# Patient Record
Sex: Female | Born: 1980 | Race: White | Hispanic: No | Marital: Married
Health system: Southern US, Community
[De-identification: ages and names within clinical notes are randomized; demographics above are authoritative.]

---

## 2006-09-20 ENCOUNTER — Inpatient Hospital Stay (HOSPITAL_COMMUNITY): Admission: AD | Admit: 2006-09-20 | Discharge: 2006-09-20 | Payer: Self-pay | Admitting: Obstetrics & Gynecology

## 2006-10-16 ENCOUNTER — Ambulatory Visit: Payer: Self-pay | Admitting: Hematology & Oncology

## 2006-11-01 ENCOUNTER — Inpatient Hospital Stay (HOSPITAL_COMMUNITY): Admission: AD | Admit: 2006-11-01 | Discharge: 2006-11-01 | Payer: Self-pay | Admitting: Obstetrics

## 2006-12-08 ENCOUNTER — Ambulatory Visit: Payer: Self-pay | Admitting: Hematology & Oncology

## 2006-12-10 LAB — CBC WITH DIFFERENTIAL/PLATELET
Basophils Absolute: 0 10*3/uL (ref 0.0–0.1)
EOS%: 1.2 % (ref 0.0–7.0)
HCT: 35.3 % (ref 34.8–46.6)
HGB: 12.3 g/dL (ref 11.6–15.9)
MCH: 29.6 pg (ref 26.0–34.0)
MCHC: 34.7 g/dL (ref 32.0–36.0)
MCV: 85.3 fL (ref 81.0–101.0)
MONO%: 6.6 % (ref 0.0–13.0)
NEUT%: 68.3 % (ref 39.6–76.8)
RDW: 14.1 % (ref 11.3–14.5)

## 2006-12-12 ENCOUNTER — Inpatient Hospital Stay (HOSPITAL_COMMUNITY): Admission: AD | Admit: 2006-12-12 | Discharge: 2006-12-12 | Payer: Self-pay | Admitting: Obstetrics and Gynecology

## 2007-03-08 ENCOUNTER — Ambulatory Visit: Payer: Self-pay | Admitting: Hematology & Oncology

## 2007-03-10 LAB — CBC WITH DIFFERENTIAL/PLATELET
Basophils Absolute: 0 10*3/uL (ref 0.0–0.1)
EOS%: 0.8 % (ref 0.0–7.0)
HCT: 37.1 % (ref 34.8–46.6)
HGB: 13.2 g/dL (ref 11.6–15.9)
LYMPH%: 19 % (ref 14.0–48.0)
MCH: 30.6 pg (ref 26.0–34.0)
MCV: 86.3 fL (ref 81.0–101.0)
NEUT%: 73.2 % (ref 39.6–76.8)
Platelets: 138 10*3/uL — ABNORMAL LOW (ref 145–400)
lymph#: 2.1 10*3/uL (ref 0.9–3.3)

## 2007-03-30 ENCOUNTER — Inpatient Hospital Stay (HOSPITAL_COMMUNITY): Admission: AD | Admit: 2007-03-30 | Discharge: 2007-03-30 | Payer: Self-pay | Admitting: Obstetrics & Gynecology

## 2007-04-08 LAB — CBC WITH DIFFERENTIAL/PLATELET
BASO%: 0.3 % (ref 0.0–2.0)
Basophils Absolute: 0 10*3/uL (ref 0.0–0.1)
Eosinophils Absolute: 0.1 10*3/uL (ref 0.0–0.5)
HCT: 35.7 % (ref 34.8–46.6)
HGB: 12.3 g/dL (ref 11.6–15.9)
LYMPH%: 18.1 % (ref 14.0–48.0)
MONO#: 0.7 10*3/uL (ref 0.1–0.9)
NEUT#: 9.1 10*3/uL — ABNORMAL HIGH (ref 1.5–6.5)
NEUT%: 75.1 % (ref 39.6–76.8)
Platelets: 135 10*3/uL — ABNORMAL LOW (ref 145–400)
WBC: 12.2 10*3/uL — ABNORMAL HIGH (ref 3.9–10.0)
lymph#: 2.2 10*3/uL (ref 0.9–3.3)

## 2007-04-24 ENCOUNTER — Inpatient Hospital Stay (HOSPITAL_COMMUNITY): Admission: AD | Admit: 2007-04-24 | Discharge: 2007-04-26 | Payer: Self-pay | Admitting: Obstetrics and Gynecology

## 2007-05-26 ENCOUNTER — Ambulatory Visit: Payer: Self-pay | Admitting: Hematology & Oncology

## 2007-05-31 LAB — CBC WITH DIFFERENTIAL/PLATELET
BASO%: 2.3 % — ABNORMAL HIGH (ref 0.0–2.0)
Eosinophils Absolute: 0.2 10*3/uL (ref 0.0–0.5)
MCHC: 34.3 g/dL (ref 32.0–36.0)
MONO#: 0.5 10*3/uL (ref 0.1–0.9)
NEUT#: 3.6 10*3/uL (ref 1.5–6.5)
Platelets: 203 10*3/uL (ref 145–400)
RBC: 4.48 10*6/uL (ref 3.70–5.32)
WBC: 6.9 10*3/uL (ref 3.9–10.0)
lymph#: 2.5 10*3/uL (ref 0.9–3.3)

## 2008-05-29 ENCOUNTER — Encounter: Admission: RE | Admit: 2008-05-29 | Discharge: 2008-05-29 | Payer: Self-pay | Admitting: Family Medicine

## 2009-08-11 IMAGING — US US OB TRANSVAGINAL MODIFY
1 series · 13 of 28 positions shown · non-contrast
Comparison: None.

CLINICAL DATA: 8 weeks pregnant with vaginal discharge.   
OBSTETRICAL ULTRASOUND <14 WKS AND TRANSVAGINAL OB US:
TECHNIQUE: Both transabdominal and transvaginal ultrasound examinations were performed for complete evaluation of the gestation as well as the maternal uterus, adnexal regions, and pelvic cul-de-sac.

[Series 1: us ob comp less 14 wks · 13 of 50 slices shown]
[im 2/50]
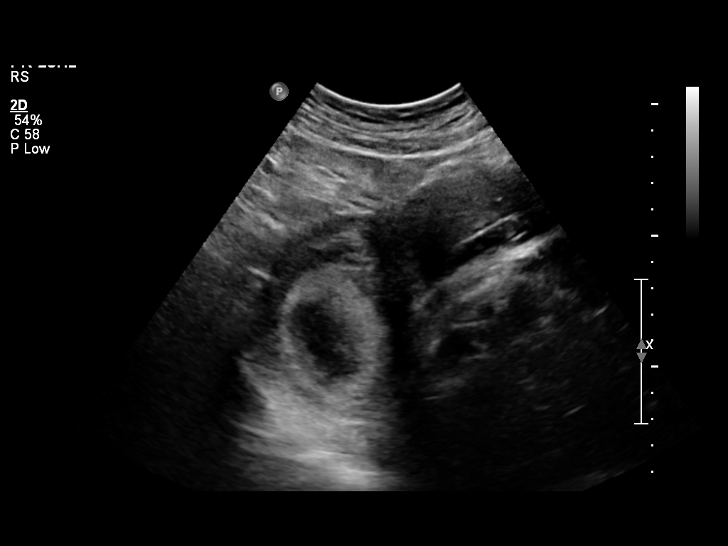
[im 6/50]
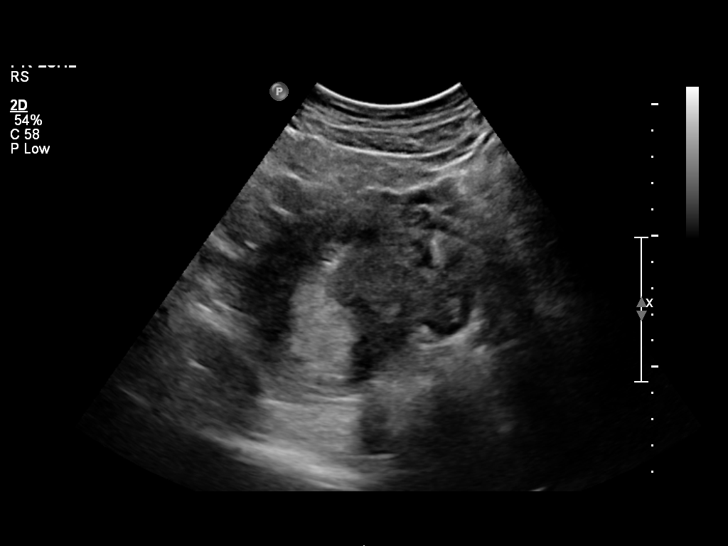
[im 10/50]
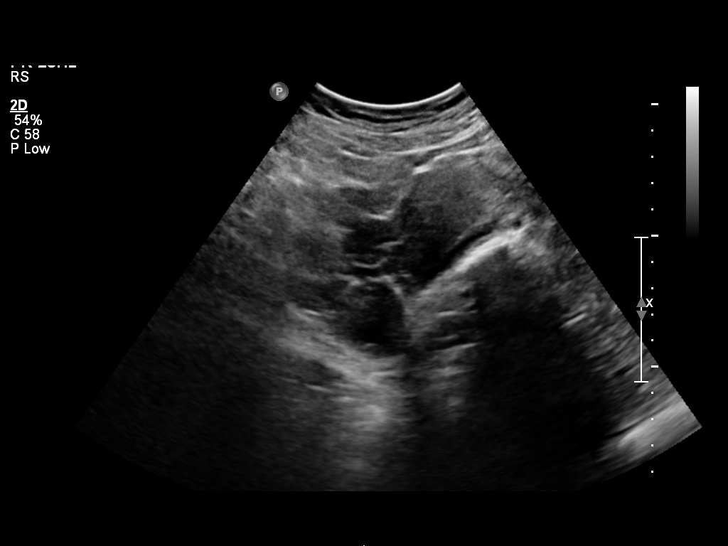
[im 13/50]
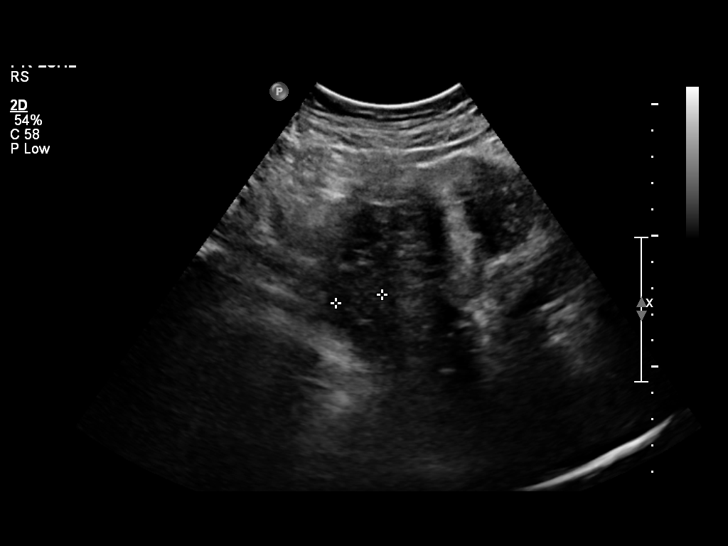
[im 17/50]
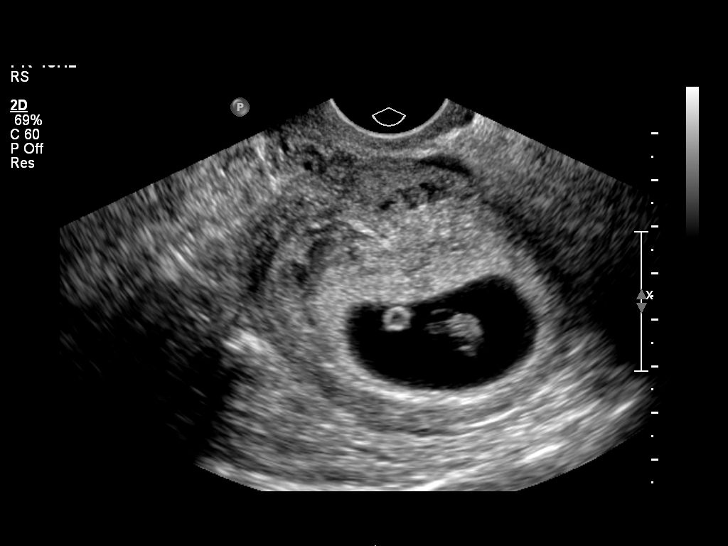
[im 20/50]
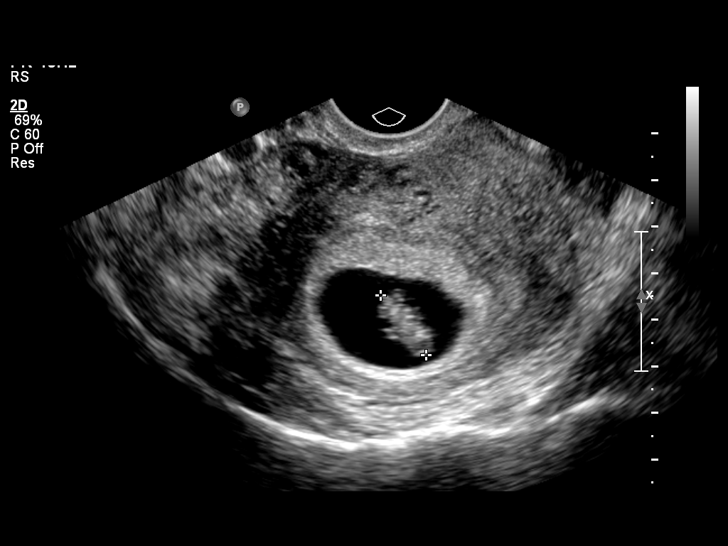
[im 26/50]
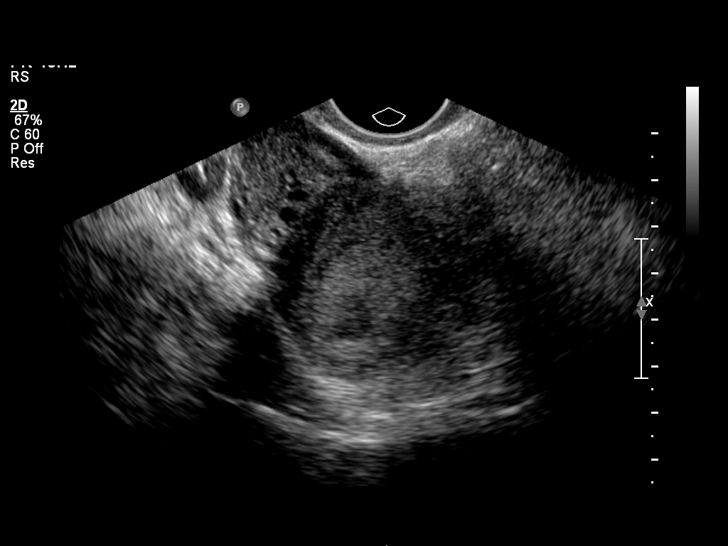
[im 30/50]
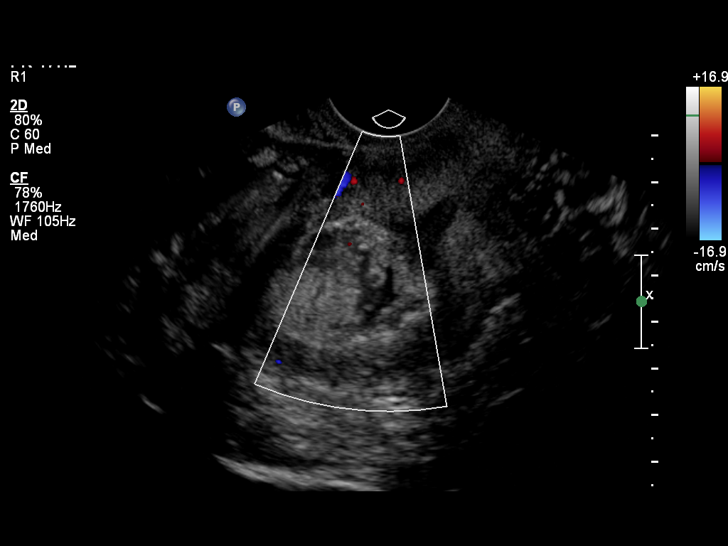
[im 33/50]
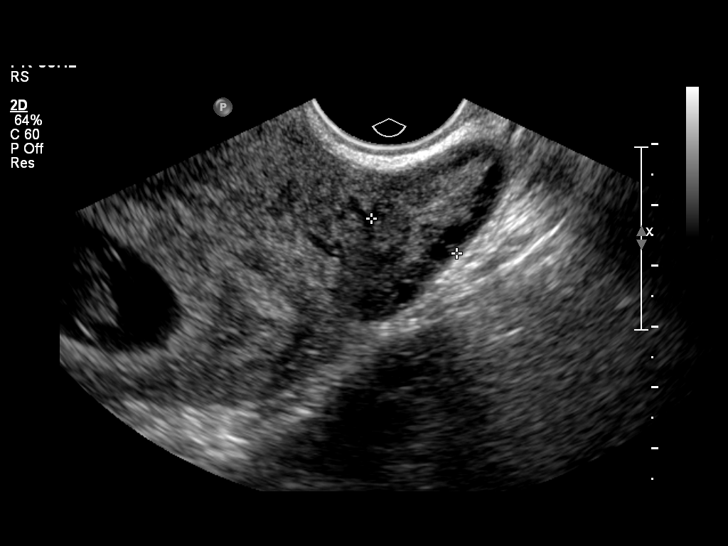
[im 37/50]
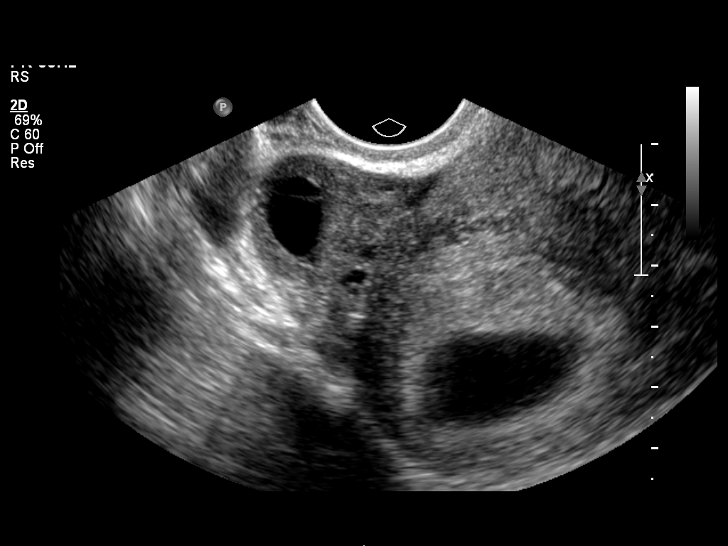
[im 40/50]
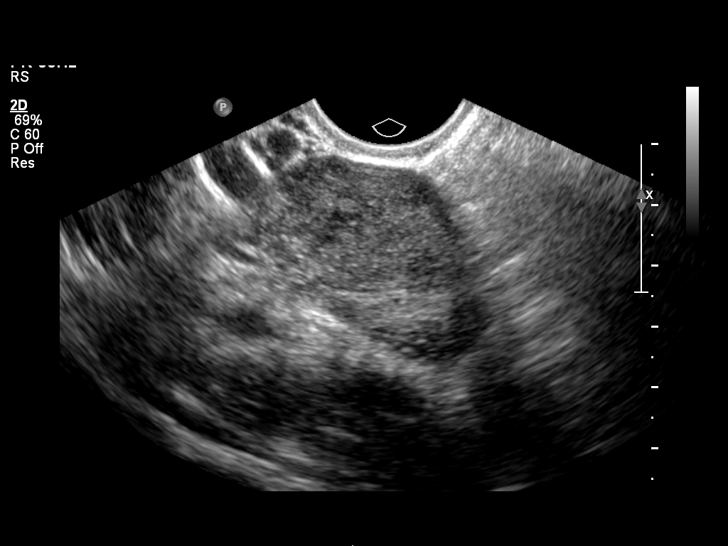
[im 44/50]
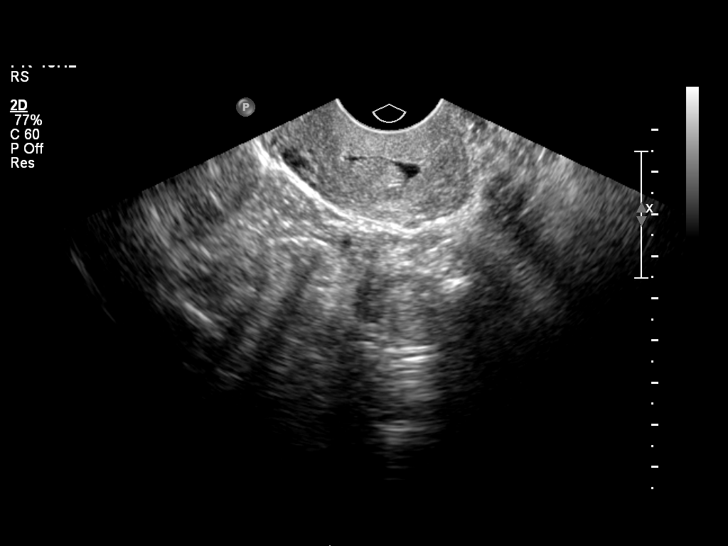
[im 48/50]
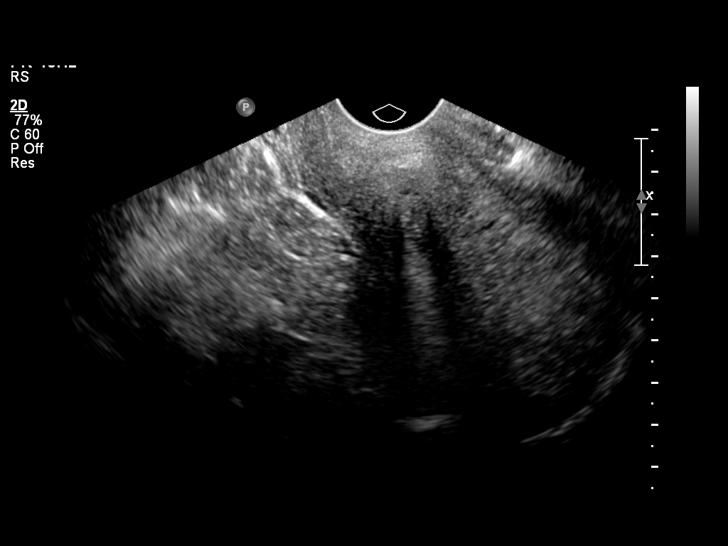

[13 of 28 positions shown; findings below may reference images not displayed]

FINDINGS: A single living intrauterine pregnancy is identified with a crown-rump length of 1.6 cm, corresponding to an estimated gestational age of 8 weeks 1 day.  This corresponds with the patient?s last menstrual period provided, with estimated gestational age by LMP of 8 weeks 3 days.  Cardiac activity is identified at 162 bpm.  Yolk sac is identified and appears normal.  A small subchorionic hemorrhage is noted.  The uterine cervix measures 3 cm in length and is closed.  Note is made of a small amount of fluid in the cervical canal.  The ovaries are normal in size, measuring 2.8 x 1.8 x 1.5 cm on the left and 2.7 x 3.7 x 2.4 cm on the right.  corpus luteum is in the right ovary.  
No free fluid is identified in the cul-de-sac.
IMPRESSION: 1.  Single living intrauterine pregnancy with estimated gestational age by LMP of 8 weeks 3 days.  The dates measured by ultrasound correspond to the LMP.  
2.  Small subchorionic hemorrhage.  
3.  Small amount of fluid in the cervical canal.

## 2010-02-18 IMAGING — US US OB COMP +14 WK
1 series · 14 of 23 positions shown · non-contrast
Comparison: none

OBSTETRICAL ULTRASOUND:

 This ultrasound exam was performed in the [HOSPITAL] Ultrasound Department.  The OB US report was generated in the [facility] system, and faxed to the ordering physician.  This report is also available in [REDACTED] PACS.

[Series 1: us ob comp +14 wk · 14 of 23 slices shown]
[im 1/23]
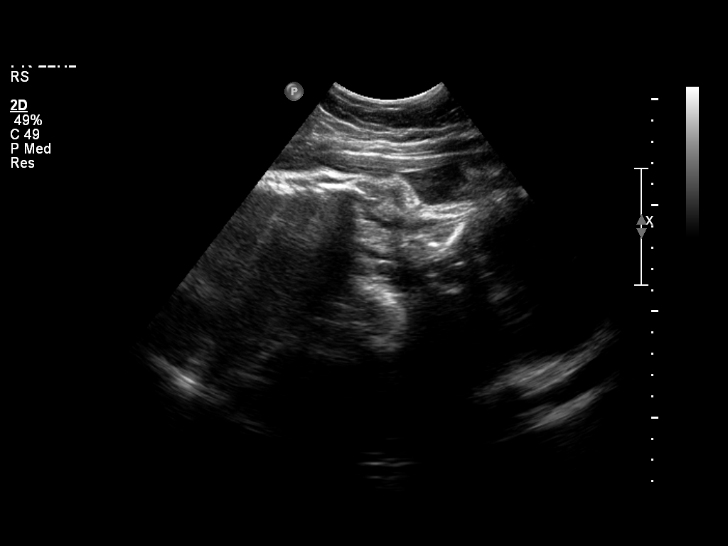
[im 3/23]
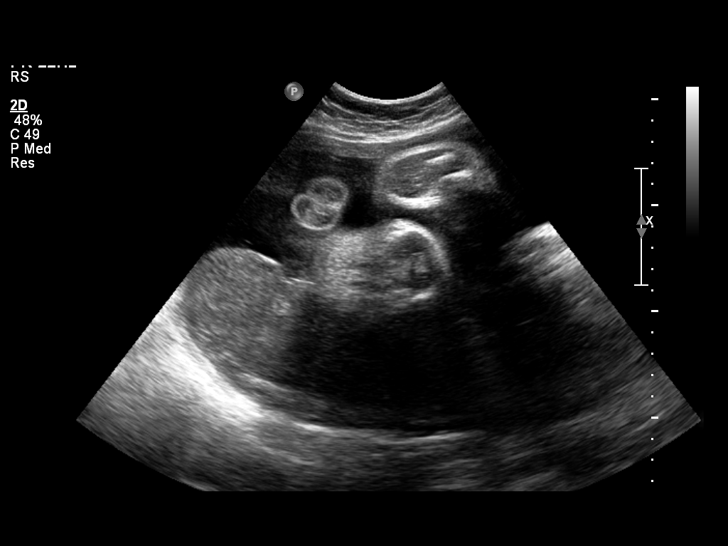
[im 5/23]
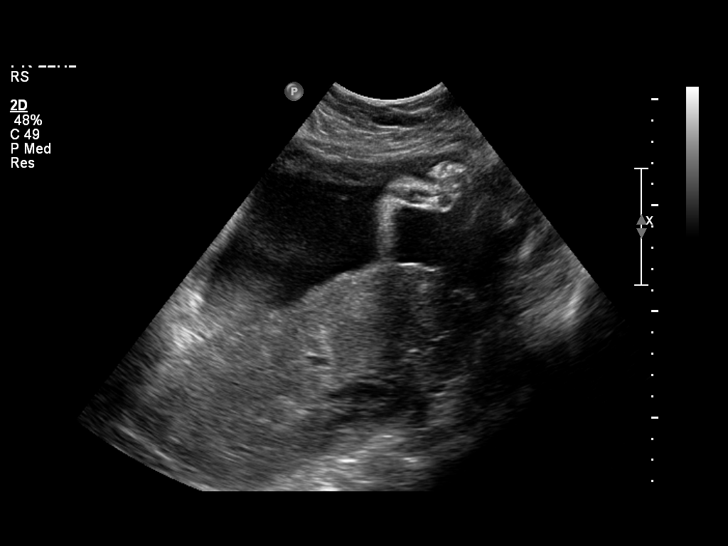
[im 6/23]
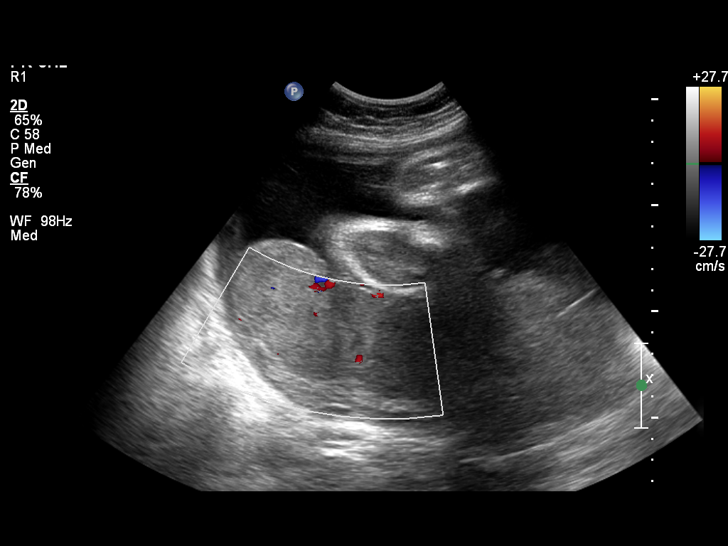
[im 8/23]
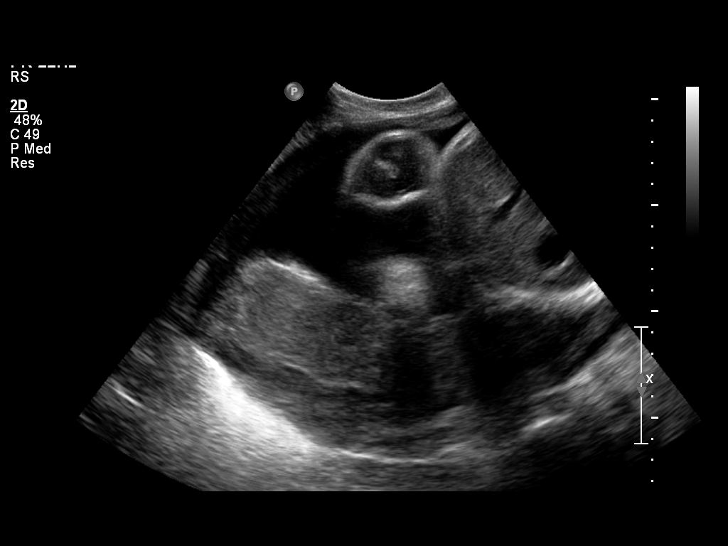
[im 10/23]
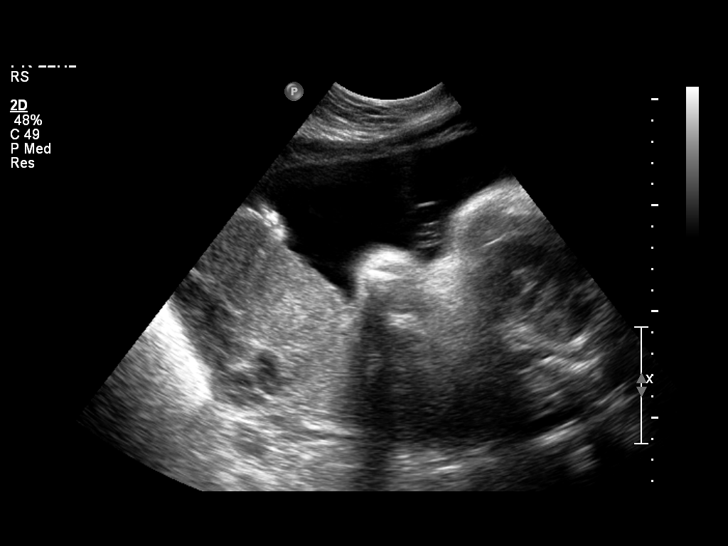
[im 11/23]
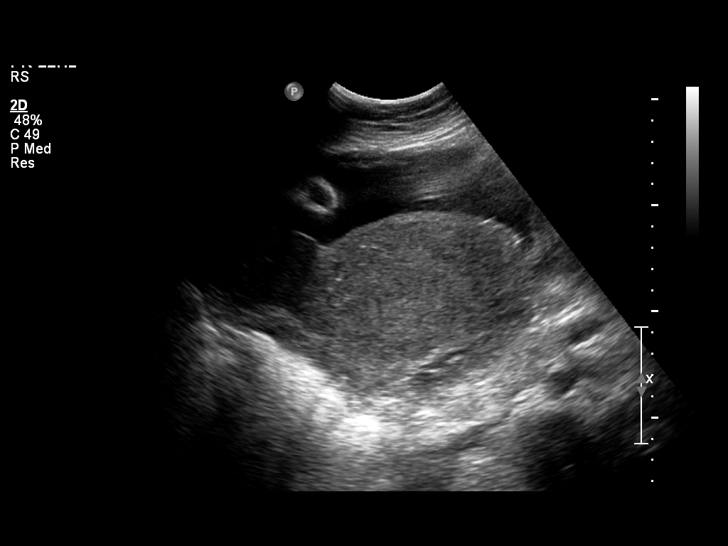
[im 13/23]
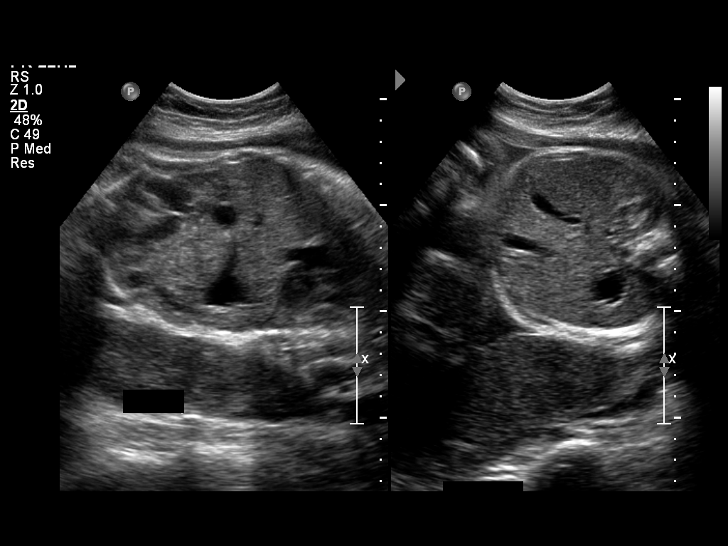
[im 14/23]
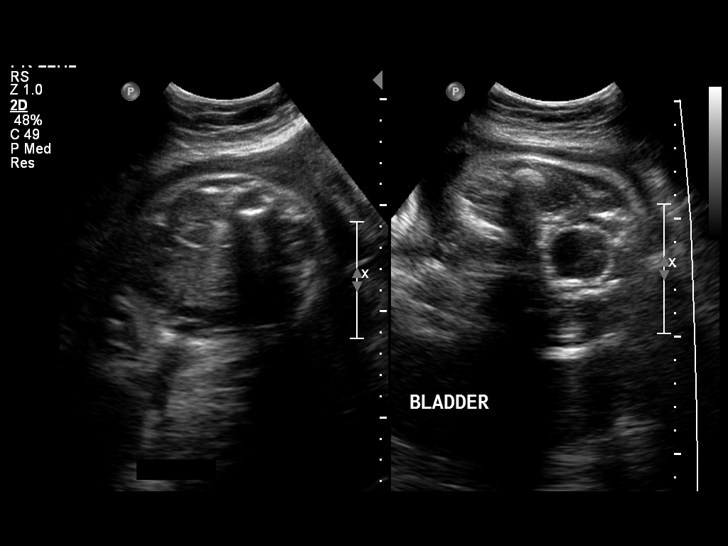
[im 16/23]
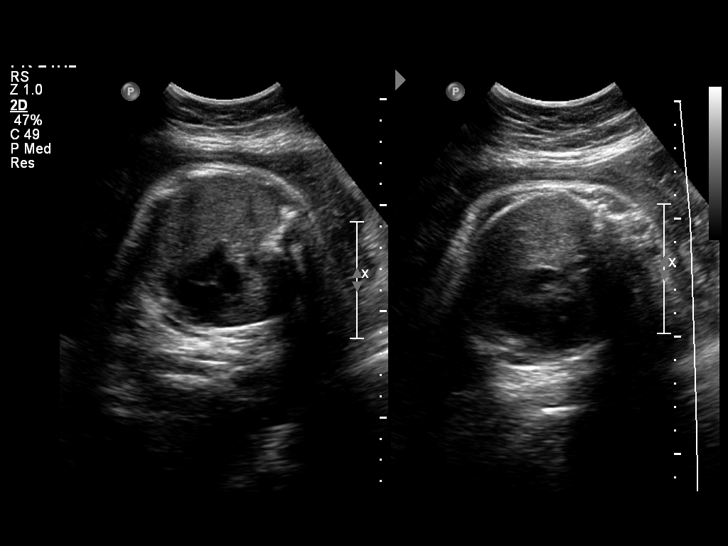
[im 18/23]
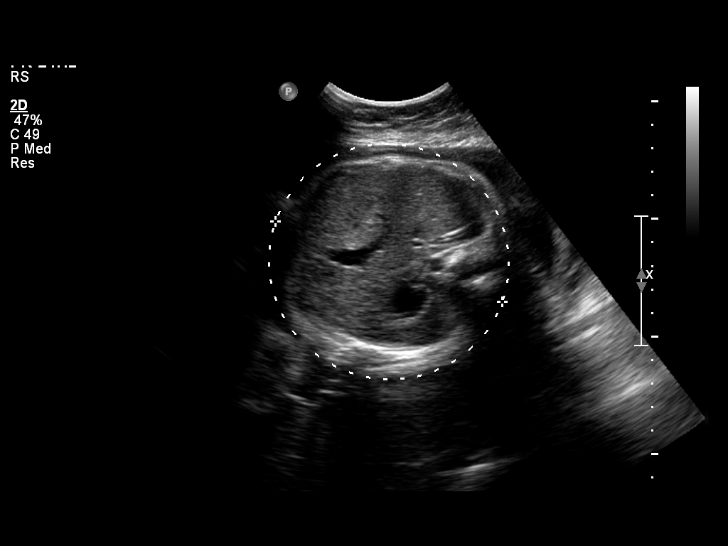
[im 19/23]
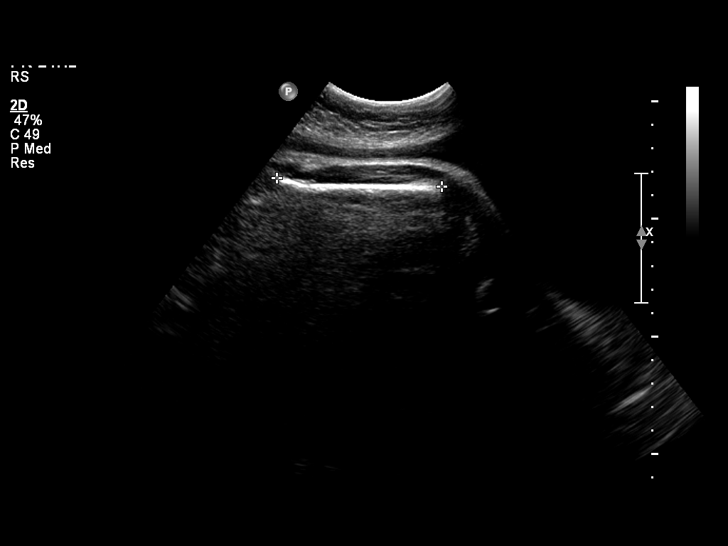
[im 21/23]
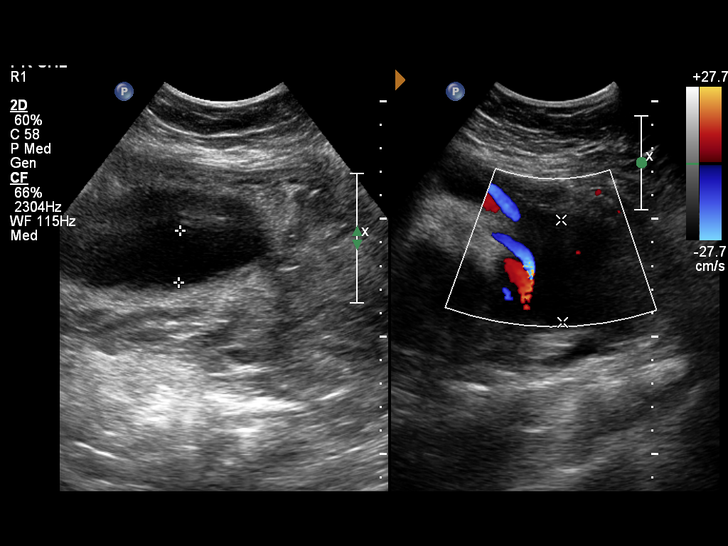
[im 23/23]
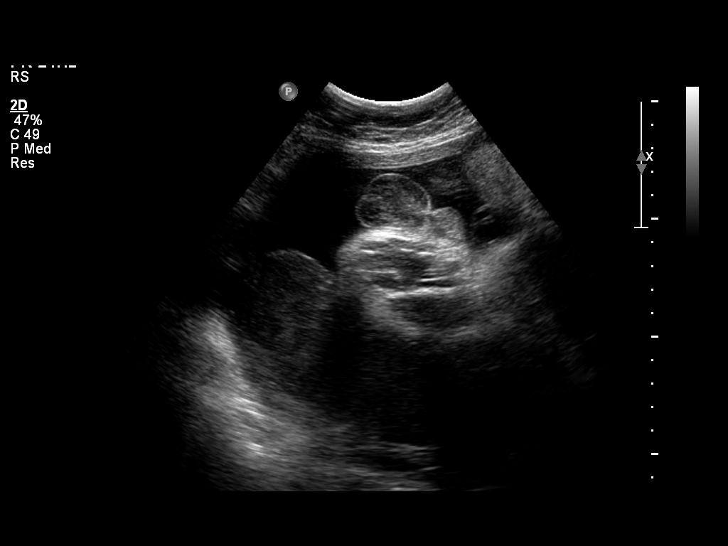

[14 of 23 positions shown; findings below may reference images not displayed]

IMPRESSION: See [facility] Obstetric US report.

## 2010-06-18 NOTE — H&P (Signed)
NAMENORELLE, RUNNION NO.:  [id number]   MEDICAL RECORD NO.:  [id number]          PATIENT TYPE:  MAT   LOCATION:  [facility]                          FACILITY:  [facility]   PHYSICIAN:  Lendon Colonel, MD   DATE OF BIRTH:  1980-12-14   DATE OF ADMISSION:  11/01/2006  DATE OF DISCHARGE:                              HISTORY & PHYSICAL   CHIEF COMPLAINT:  Abnormal feeling of cervix.   HISTORY OF PRESENT ILLNESS:  This 30 year old G3, P1-0-1-1 at 64 weeks'  gestation who presents with a question of cervical shortening.  The  patient had painful intercourse today and then checked her cervix.  She  said her cervix felt abnormal and shorter than before.  The patient  states that she regularly checks her cervix.  She denies vaginal  bleeding or vaginal discharge.  She has no contractions or abdominal  pain.  She has no fevers.   PAST OBSTETRICAL HISTORY:  Significant for:  1. Early SAB.  2. Term vaginal delivery at 38 weeks, complicated by pre-eclampsia and      a partial abruption that began at 22 weeks.   ALLERGIES:  PENICILLIN.   PAST MEDICAL HISTORY:  Unremarkable.   PHYSICAL EXAMINATION:  VITAL SIGNS:  The patient is afebrile with stable  vitals.  HEART:  Regular rate and rhythm.  LUNGS:  Clear to auscultation bilaterally.  ABDOMEN:  Soft and nontender.  PELVIC:  A sterile speculum exam is within limit.  Of note her cervix  does appear to be shorter at the 4 o'clock position than the remainder  of the cervix. On digital exam there were no palpable abnormalities.  External os was 1-cm dilated.  her internal os was closed.  The anterior  lip of the cervix was approximately 3-cm in length.  The posterior  length of the cervix was approximately 2.5-cm in length.  There was  significantly more substance to the anterior aspect of the cervix than  the posterior.   LABORATORY:  Urinalysis was negative.   ASSESSMENT/PLAN:  This is a 30 year old gravida 3, para 1 at 33  plus  weeks who presents with a question of cervical shortening.  No cervical  shortening or cervical abnormalities which would threaten miscarriage or  preterm  labor were noted at this point.  The patient was advised to not check  her cervix until term gestation as this would limit our diagnostic  capability (FFN) in the future. The patient was discharged home with  labor precautions.  The patient was also told to return with heavy  vaginal discharge, vaginal bleeding, or uterine contractions.      Lendon Colonel, MD  Electronically Signed     KAF/MEDQ  D:  11/01/2006  T:  11/01/2006  Job:  [id number]

## 2010-10-25 LAB — COMPREHENSIVE METABOLIC PANEL
ALT: 17
AST: 16
Albumin: 2.8 — ABNORMAL LOW
Calcium: 9.2
GFR calc Af Amer: >60
Sodium: 134 — ABNORMAL LOW
Total Protein: 6.3

## 2010-10-25 LAB — CBC
MCHC: 34.7
RDW: 15.3

## 2010-10-28 LAB — CBC
HCT: 35.4 — ABNORMAL LOW
Hemoglobin: 11.9 — ABNORMAL LOW
MCHC: 34.1
MCHC: 34.9
MCV: 86.6
MCV: 86.8
MCV: 87.3
Platelets: 101 — ABNORMAL LOW
RBC: 3.92
RBC: 4.09
RDW: 15.4
WBC: 10.5
WBC: 12.9 — ABNORMAL HIGH

## 2010-10-28 LAB — COMPREHENSIVE METABOLIC PANEL
AST: 15
AST: 19
Albumin: 2.5 — ABNORMAL LOW
Alkaline Phosphatase: 107
CO2: 22
Calcium: 8.8
Chloride: 103
Creatinine, Ser: 0.42
Creatinine, Ser: 0.61
GFR calc Af Amer: >60
GFR calc Af Amer: >60
GFR calc non Af Amer: >60
Total Bilirubin: 0.7
Total Protein: 5.7 — ABNORMAL LOW

## 2010-10-28 LAB — DIFFERENTIAL
Eosinophils Relative: 0
Lymphocytes Relative: 19
Lymphs Abs: 2.4
Monocytes Absolute: 0.8

## 2010-10-28 LAB — SAMPLE TO BLOOD BANK

## 2010-11-14 LAB — URINALYSIS, ROUTINE W REFLEX MICROSCOPIC
Bilirubin Urine: NEGATIVE
Glucose, UA: NEGATIVE
Hgb urine dipstick: NEGATIVE
Ketones, ur: NEGATIVE
Nitrite: NEGATIVE
Protein, ur: NEGATIVE
Specific Gravity, Urine: >1.03 — ABNORMAL HIGH
Urobilinogen, UA: 0.2
pH: 6

## 2010-11-15 LAB — URINALYSIS, ROUTINE W REFLEX MICROSCOPIC
Nitrite: NEGATIVE
Specific Gravity, Urine: >1.03 — ABNORMAL HIGH
Urobilinogen, UA: 0.2
pH: 5.5

## 2010-11-15 LAB — WET PREP, GENITAL
Clue Cells Wet Prep HPF POC: NONE SEEN
Trich, Wet Prep: NONE SEEN

## 2017-04-25 ENCOUNTER — Other Ambulatory Visit: Payer: Self-pay

## 2017-04-25 ENCOUNTER — Emergency Department (HOSPITAL_COMMUNITY)
Admission: EM | Admit: 2017-04-25 | Discharge: 2017-04-25 | Disposition: A | Discharge status: Home / Self Care | Payer: Self-pay | Attending: Emergency Medicine | Admitting: Emergency Medicine

## 2017-04-25 DIAGNOSIS — R0602 Shortness of breath: Secondary | ICD-10-CM | POA: Insufficient documentation

## 2017-04-25 DIAGNOSIS — R51 Headache: Secondary | ICD-10-CM | POA: Insufficient documentation

## 2017-04-25 DIAGNOSIS — Z5321 Procedure and treatment not carried out due to patient leaving prior to being seen by health care provider: Secondary | ICD-10-CM | POA: Insufficient documentation

## 2017-04-25 DIAGNOSIS — R079 Chest pain, unspecified: Secondary | ICD-10-CM | POA: Insufficient documentation

## 2017-04-25 NOTE — ED Notes (Addendum)
Pt c/o intermittent stinging/burning left sided chest pain, left shoulder, and left arm pain that feels heavy onset 19:30 after eating dinner.
